# Patient Record
Sex: Male | Born: 1995 | Race: Black or African American | Hispanic: No | Marital: Single | Smoking: Current every day smoker
Health system: Southern US, Community
[De-identification: ages and names within clinical notes are randomized; demographics above are authoritative.]

## PROBLEM LIST (undated history)

## (undated) DIAGNOSIS — J45909 Unspecified asthma, uncomplicated: Secondary | ICD-10-CM

## (undated) HISTORY — PX: TONSILLECTOMY: SUR1361

---

## 2016-07-06 DIAGNOSIS — K1379 Other lesions of oral mucosa: Secondary | ICD-10-CM | POA: Insufficient documentation

## 2016-07-06 DIAGNOSIS — R0683 Snoring: Secondary | ICD-10-CM | POA: Insufficient documentation

## 2016-07-06 DIAGNOSIS — G4730 Sleep apnea, unspecified: Secondary | ICD-10-CM | POA: Insufficient documentation

## 2016-07-06 DIAGNOSIS — K219 Gastro-esophageal reflux disease without esophagitis: Secondary | ICD-10-CM | POA: Insufficient documentation

## 2016-07-06 DIAGNOSIS — J342 Deviated nasal septum: Secondary | ICD-10-CM | POA: Insufficient documentation

## 2016-07-06 DIAGNOSIS — J343 Hypertrophy of nasal turbinates: Secondary | ICD-10-CM | POA: Insufficient documentation

## 2016-07-19 DIAGNOSIS — J353 Hypertrophy of tonsils with hypertrophy of adenoids: Secondary | ICD-10-CM | POA: Insufficient documentation

## 2016-07-26 DIAGNOSIS — K1379 Other lesions of oral mucosa: Secondary | ICD-10-CM | POA: Insufficient documentation

## 2016-08-01 DIAGNOSIS — Z9089 Acquired absence of other organs: Secondary | ICD-10-CM | POA: Insufficient documentation

## 2016-12-18 ENCOUNTER — Emergency Department (HOSPITAL_COMMUNITY): Payer: Self-pay

## 2016-12-18 ENCOUNTER — Emergency Department (HOSPITAL_COMMUNITY)
Admission: EM | Admit: 2016-12-18 | Discharge: 2016-12-18 | Disposition: A | Discharge status: Home / Self Care | Payer: Self-pay | Attending: Emergency Medicine | Admitting: Emergency Medicine

## 2016-12-18 ENCOUNTER — Encounter (HOSPITAL_COMMUNITY): Payer: Self-pay | Admitting: Emergency Medicine

## 2016-12-18 DIAGNOSIS — R5383 Other fatigue: Secondary | ICD-10-CM | POA: Insufficient documentation

## 2016-12-18 DIAGNOSIS — B349 Viral infection, unspecified: Secondary | ICD-10-CM | POA: Insufficient documentation

## 2016-12-18 DIAGNOSIS — J45909 Unspecified asthma, uncomplicated: Secondary | ICD-10-CM | POA: Insufficient documentation

## 2016-12-18 HISTORY — DX: Unspecified asthma, uncomplicated: J45.909

## 2016-12-18 LAB — URINALYSIS, ROUTINE W REFLEX MICROSCOPIC
Bilirubin Urine: NEGATIVE
GLUCOSE, UA: NEGATIVE mg/dL
HGB URINE DIPSTICK: NEGATIVE
KETONES UR: NEGATIVE mg/dL
LEUKOCYTES UA: NEGATIVE
Nitrite: NEGATIVE
PROTEIN: NEGATIVE mg/dL
Specific Gravity, Urine: 1.025 (ref 1.005–1.030)
pH: 6 (ref 5.0–8.0)

## 2016-12-18 LAB — CBC WITH DIFFERENTIAL/PLATELET
Basophils Absolute: 0 10*3/uL (ref 0.0–0.1)
Basophils Relative: 0 %
Eosinophils Absolute: 0.2 10*3/uL (ref 0.0–0.7)
Eosinophils Relative: 2 %
HEMATOCRIT: 39.3 % (ref 39.0–52.0)
HEMOGLOBIN: 12.7 g/dL — AB (ref 13.0–17.0)
LYMPHS ABS: 3.1 10*3/uL (ref 0.7–4.0)
Lymphocytes Relative: 27 %
MCH: 28.2 pg (ref 26.0–34.0)
MCHC: 32.3 g/dL (ref 30.0–36.0)
MCV: 87.3 fL (ref 78.0–100.0)
MONO ABS: 0.7 10*3/uL (ref 0.1–1.0)
MONOS PCT: 6 %
NEUTROS ABS: 7.6 10*3/uL (ref 1.7–7.7)
NEUTROS PCT: 65 %
Platelets: 328 10*3/uL (ref 150–400)
RBC: 4.5 MIL/uL (ref 4.22–5.81)
RDW: 14.4 % (ref 11.5–15.5)
WBC: 11.6 10*3/uL — ABNORMAL HIGH (ref 4.0–10.5)

## 2016-12-18 LAB — BASIC METABOLIC PANEL
Anion gap: 9 (ref 5–15)
BUN: 19 mg/dL (ref 6–20)
CALCIUM: 8.8 mg/dL — AB (ref 8.9–10.3)
CHLORIDE: 105 mmol/L (ref 101–111)
CO2: 24 mmol/L (ref 22–32)
Creatinine, Ser: 1.33 mg/dL — ABNORMAL HIGH (ref 0.61–1.24)
GFR calc Af Amer: >60 mL/min (ref >60)
GLUCOSE: 113 mg/dL — AB (ref 65–99)
Potassium: 3.9 mmol/L (ref 3.5–5.1)
Sodium: 138 mmol/L (ref 135–145)

## 2016-12-18 LAB — RAPID STREP SCREEN (MED CTR MEBANE ONLY): Streptococcus, Group A Screen (Direct): NEGATIVE

## 2016-12-18 LAB — MONONUCLEOSIS SCREEN: Mono Screen: NEGATIVE

## 2016-12-18 NOTE — ED Provider Notes (Signed)
[facility]-EMERGENCY DEPT Provider Note   CSN: [id number] Arrival date & time: 12/18/16  0134     History   Chief Complaint Chief Complaint  Patient presents with  . cold symptoms    HPI Allen Moody is a 21 y.o. male.  HPI PT comes in with cc of generalized body aches, fatigue, headaches, sore throat. Symptoms have been present for several days now, however last night he felt the worst so he decided to come to the er. There is no associated n/v/f/c. Pt denies any sick contacts. Pt is a [employer] and he reports waking up extremely sore, but the symptoms have no direct correlation to his practice and he has never had symptoms like this in the past.    Past Medical History:  Diagnosis Date  . Asthma     There are no active problems to display for this patient.   Past Surgical History:  Procedure Laterality Date  . TONSILLECTOMY         Home Medications    Prior to Admission medications   Medication Sig Start Date End Date Taking? Authorizing Provider  Pseudoephedrine-Acetaminophen (ALKA-SELTZER PLUS COLD/SINUS PO) Take 1 tablet by mouth daily as needed (cold sx).   Yes [provider]    Family History History reviewed. No pertinent family history.  Social History Social History  Substance Use Topics  . Smoking status: Never Smoker  . Smokeless tobacco: Never Used  . Alcohol use No     Allergies   Patient has no known allergies.   Review of Systems Review of Systems  Constitutional: Positive for activity change. Negative for chills and fever.  HENT: Positive for sore throat.   Respiratory: Negative for cough.   Cardiovascular: Negative for chest pain.  Gastrointestinal: Negative for abdominal distention.  Musculoskeletal: Positive for arthralgias and myalgias. Negative for neck pain and neck stiffness.  Skin: Negative for rash.  Allergic/Immunologic: Negative for immunocompromised state.  Neurological: Positive for dizziness, weakness and  headaches.     Physical Exam Updated Vital Signs BP 130/79 (BP Location: Right Arm)   Pulse 78   Temp 97.8 F (36.6 C) (Oral)   Resp 20   Ht  (1.854 m)   Wt (!) 147.4 kg (325 lb)   SpO2 98%   BMI 42.88 kg/m   Physical Exam  Constitutional: He is oriented to person, place, and time. He appears well-developed.  HENT:  Head: Atraumatic.  Neck: Neck supple.  No nuchal rigidity  Cardiovascular: Normal rate.   Pulmonary/Chest: Effort normal and breath sounds normal. He has no wheezes.  Abdominal: Soft. There is no tenderness. There is no guarding.  Lymphadenopathy:    He has no cervical adenopathy.  Neurological: He is alert and oriented to person, place, and time.  Skin: Skin is warm.  Nursing note and vitals reviewed.    ED Treatments / Results  Labs (all labs ordered are listed, but only abnormal results are displayed) Labs Reviewed  CBC WITH DIFFERENTIAL/PLATELET - Abnormal; Notable for the following:       Result Value   WBC 11.6 (*)    Hemoglobin 12.7 (*)    All other components within normal limits  BASIC METABOLIC PANEL - Abnormal; Notable for the following:    Glucose, Bld 113 (*)    Creatinine, Ser 1.33 (*)    Calcium 8.8 (*)    All other components within normal limits  RAPID STREP SCREEN (NOT AT [facility])  CULTURE, GROUP A STREP [facility])  URINALYSIS, ROUTINE W REFLEX MICROSCOPIC  MONONUCLEOSIS SCREEN    EKG  EKG Interpretation None       Radiology Dg Chest Port 1 View  Result Date: 12/18/2016 CLINICAL DATA:  Shortness of breath and cough today. EXAM: PORTABLE CHEST 1 VIEW COMPARISON:  None. FINDINGS: Shallow inspiration. Normal heart size and pulmonary vascularity. No focal airspace disease or consolidation in the lungs. No blunting of costophrenic angles. No pneumothorax. Mediastinal contours appear intact. IMPRESSION: No active disease. Electronically Signed   By: Burman NievesWilliam  Stevens M.D.   On: 12/18/2016 06:28    Procedures Procedures (including  critical care time)  Medications Ordered in ED Medications - No data to display   Initial Impression / Assessment and Plan / ED Course  I have reviewed the triage vital signs and the nursing notes.  Pertinent labs & imaging results that were available during my care of the patient were reviewed by me and considered in my medical decision making (see chart for details).     Pt comes in with multiple non specific complains. Labs are reassuring. UA shows no hemoglobin and K is fine, so I doubt rhabdo.  We ordered strep and mono, and they are neg. CBC is unremarkable. Pt has no nuchal rigidity and the headache is mild currently. Symptoms have been present for a long time now. I am not sure what the cause of his symptoms are. I think over training is possible, dehydration is possible - but pt doesn't think that's the case. I have asked him to exercise caution and to listne to his body. Strict ER return precautions have been discussed, and patient is agreeing with the plan and is comfortable with the workup done and the recommendations from the ER.   Final Clinical Impressions(s) / ED Diagnoses   Final diagnoses:  Acute viral syndrome  Fatigue, unspecified type    New Prescriptions Discharge Medication List as of 12/18/2016  7:10 AM       Derwood KaplanNanavati, Merve Hotard, MD 12/18/16 [id number]

## 2016-12-18 NOTE — ED Triage Notes (Signed)
Pt c/o generalized body ache and HA, extreme fatigue and with flue like symptoms. No fever or chills.

## 2016-12-18 NOTE — Discharge Instructions (Signed)
All the results in the ER are normal, labs and imaging. We are not sure what is causing your symptoms. The workup in the ER is not complete, and is limited to screening for life threatening and emergent conditions only, so please see a primary care doctor for further evaluation.  Please hydrate well. Give your body some rest if you think you are being over trained.

## 2016-12-20 LAB — CULTURE, GROUP A STREP (THRC)

## 2018-09-15 ENCOUNTER — Emergency Department (HOSPITAL_BASED_OUTPATIENT_CLINIC_OR_DEPARTMENT_OTHER)
Admission: EM | Admit: 2018-09-15 | Discharge: 2018-09-15 | Disposition: A | Discharge status: Home / Self Care | Payer: Self-pay | Attending: Emergency Medicine | Admitting: Emergency Medicine

## 2018-09-15 ENCOUNTER — Encounter (HOSPITAL_BASED_OUTPATIENT_CLINIC_OR_DEPARTMENT_OTHER): Payer: Self-pay | Admitting: Emergency Medicine

## 2018-09-15 ENCOUNTER — Other Ambulatory Visit: Payer: Self-pay

## 2018-09-15 ENCOUNTER — Emergency Department (HOSPITAL_BASED_OUTPATIENT_CLINIC_OR_DEPARTMENT_OTHER): Payer: Self-pay

## 2018-09-15 DIAGNOSIS — S022XXA Fracture of nasal bones, initial encounter for closed fracture: Secondary | ICD-10-CM | POA: Insufficient documentation

## 2018-09-15 DIAGNOSIS — J45909 Unspecified asthma, uncomplicated: Secondary | ICD-10-CM | POA: Insufficient documentation

## 2018-09-15 DIAGNOSIS — W51XXXA Accidental striking against or bumped into by another person, initial encounter: Secondary | ICD-10-CM | POA: Insufficient documentation

## 2018-09-15 DIAGNOSIS — Y999 Unspecified external cause status: Secondary | ICD-10-CM | POA: Insufficient documentation

## 2018-09-15 DIAGNOSIS — F17200 Nicotine dependence, unspecified, uncomplicated: Secondary | ICD-10-CM | POA: Insufficient documentation

## 2018-09-15 DIAGNOSIS — Y939 Activity, unspecified: Secondary | ICD-10-CM | POA: Insufficient documentation

## 2018-09-15 DIAGNOSIS — Y929 Unspecified place or not applicable: Secondary | ICD-10-CM | POA: Insufficient documentation

## 2018-09-15 NOTE — ED Notes (Signed)
Patient transported to CT 
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
## 2018-09-15 NOTE — ED Notes (Signed)
Pt given a mask to wear prior to transport to CT. He states he feels he will be comfortable enough wearing it despite his facial injury.

## 2018-09-15 NOTE — ED Notes (Signed)
Pt understood dc material. NAD noted. All questions answered to satisfaction. Pt escorted to check out window 

## 2018-09-17 DIAGNOSIS — S022XXB Fracture of nasal bones, initial encounter for open fracture: Secondary | ICD-10-CM | POA: Insufficient documentation

## 2018-11-07 ENCOUNTER — Encounter (HOSPITAL_COMMUNITY): Payer: Self-pay | Admitting: Emergency Medicine

## 2018-11-07 ENCOUNTER — Other Ambulatory Visit: Payer: Self-pay

## 2018-11-07 ENCOUNTER — Emergency Department (HOSPITAL_COMMUNITY)
Admission: EM | Admit: 2018-11-07 | Discharge: 2018-11-07 | Discharge status: Against Medical Advice | Payer: Self-pay | Attending: Emergency Medicine | Admitting: Emergency Medicine

## 2018-11-07 DIAGNOSIS — Z5321 Procedure and treatment not carried out due to patient leaving prior to being seen by health care provider: Secondary | ICD-10-CM | POA: Insufficient documentation
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
## 2020-04-21 ENCOUNTER — Emergency Department (HOSPITAL_COMMUNITY)
Admission: EM | Admit: 2020-04-21 | Discharge: 2020-04-21 | Disposition: A | Discharge status: Home / Self Care | Payer: No Typology Code available for payment source | Attending: Emergency Medicine | Admitting: Emergency Medicine

## 2020-04-21 ENCOUNTER — Emergency Department (HOSPITAL_COMMUNITY): Payer: No Typology Code available for payment source

## 2020-04-21 DIAGNOSIS — S301XXA Contusion of abdominal wall, initial encounter: Secondary | ICD-10-CM

## 2020-04-21 DIAGNOSIS — R519 Headache, unspecified: Secondary | ICD-10-CM | POA: Insufficient documentation

## 2020-04-21 DIAGNOSIS — Y92410 Unspecified street and highway as the place of occurrence of the external cause: Secondary | ICD-10-CM | POA: Diagnosis not present

## 2020-04-21 DIAGNOSIS — M25551 Pain in right hip: Secondary | ICD-10-CM | POA: Insufficient documentation

## 2020-04-21 DIAGNOSIS — M25512 Pain in left shoulder: Secondary | ICD-10-CM | POA: Insufficient documentation

## 2020-04-21 DIAGNOSIS — R0781 Pleurodynia: Secondary | ICD-10-CM | POA: Insufficient documentation

## 2020-04-21 DIAGNOSIS — J45909 Unspecified asthma, uncomplicated: Secondary | ICD-10-CM | POA: Diagnosis not present

## 2020-04-21 DIAGNOSIS — F17290 Nicotine dependence, other tobacco product, uncomplicated: Secondary | ICD-10-CM | POA: Diagnosis not present

## 2020-04-21 DIAGNOSIS — S20219A Contusion of unspecified front wall of thorax, initial encounter: Secondary | ICD-10-CM

## 2020-04-21 LAB — COMPREHENSIVE METABOLIC PANEL
ALT: 31 U/L (ref 0–44)
AST: 25 U/L (ref 15–41)
Albumin: 2.8 g/dL — ABNORMAL LOW (ref 3.5–5.0)
Alkaline Phosphatase: 44 U/L (ref 38–126)
Anion gap: 6 (ref 5–15)
BUN: 10 mg/dL (ref 6–20)
CO2: 21 mmol/L — ABNORMAL LOW (ref 22–32)
Calcium: 6.5 mg/dL — ABNORMAL LOW (ref 8.9–10.3)
Chloride: 114 mmol/L — ABNORMAL HIGH (ref 98–111)
Creatinine, Ser: 0.87 mg/dL (ref 0.61–1.24)
GFR, Estimated: >60 mL/min (ref >60)
Glucose, Bld: 72 mg/dL (ref 70–99)
Potassium: 3.1 mmol/L — ABNORMAL LOW (ref 3.5–5.1)
Sodium: 141 mmol/L (ref 135–145)
Total Bilirubin: 0.4 mg/dL (ref 0.3–1.2)
Total Protein: 5 g/dL — ABNORMAL LOW (ref 6.5–8.1)

## 2020-04-21 LAB — CBC WITH DIFFERENTIAL/PLATELET
Abs Immature Granulocytes: 0.05 10*3/uL (ref 0.00–0.07)
Basophils Absolute: 0 10*3/uL (ref 0.0–0.1)
Basophils Relative: 0 %
Eosinophils Absolute: 0 10*3/uL (ref 0.0–0.5)
Eosinophils Relative: 0 %
HCT: 42.8 % (ref 39.0–52.0)
Hemoglobin: 14 g/dL (ref 13.0–17.0)
Immature Granulocytes: 0 %
Lymphocytes Relative: 20 %
Lymphs Abs: 2.7 10*3/uL (ref 0.7–4.0)
MCH: 29.4 pg (ref 26.0–34.0)
MCHC: 32.7 g/dL (ref 30.0–36.0)
MCV: 89.9 fL (ref 80.0–100.0)
Monocytes Absolute: 0.9 10*3/uL (ref 0.1–1.0)
Monocytes Relative: 6 %
Neutro Abs: 9.9 10*3/uL — ABNORMAL HIGH (ref 1.7–7.7)
Neutrophils Relative %: 74 %
Platelets: 286 10*3/uL (ref 150–400)
RBC: 4.76 MIL/uL (ref 4.22–5.81)
RDW: 13.2 % (ref 11.5–15.5)
WBC: 13.6 10*3/uL — ABNORMAL HIGH (ref 4.0–10.5)
nRBC: 0 % (ref 0.0–0.2)

## 2020-04-21 MED ORDER — HYDROMORPHONE HCL 1 MG/ML IJ SOLN
1.0000 mg | Freq: Once | INTRAMUSCULAR | Status: AC
  Administered 2020-04-21: 1 mg via INTRAVENOUS
  Filled 2020-04-21: qty 1

## 2020-04-21 MED ORDER — IOHEXOL 300 MG/ML  SOLN
100.0000 mL | Freq: Once | INTRAMUSCULAR | Status: AC | PRN
  Administered 2020-04-21: 100 mL via INTRAVENOUS

## 2020-04-21 MED ORDER — METHOCARBAMOL 500 MG PO TABS: 500.0000 mg | ORAL_TABLET | Freq: Two times a day (BID) | ORAL | 0 refills | Status: DC
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
## 2020-04-21 NOTE — ED Notes (Signed)
Pt returned from CT at this time.  
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
## 2020-07-03 DIAGNOSIS — M545 Low back pain, unspecified: Secondary | ICD-10-CM | POA: Insufficient documentation

## 2020-07-03 DIAGNOSIS — R0789 Other chest pain: Secondary | ICD-10-CM | POA: Insufficient documentation
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
## 2022-05-10 ENCOUNTER — Ambulatory Visit (HOSPITAL_COMMUNITY)
Admission: EM | Admit: 2022-05-10 | Discharge: 2022-05-10 | Disposition: A | Discharge status: Home / Self Care | Payer: 59 | Attending: Family Medicine | Admitting: Family Medicine

## 2022-05-10 ENCOUNTER — Encounter (HOSPITAL_COMMUNITY): Payer: Self-pay

## 2022-05-10 DIAGNOSIS — J069 Acute upper respiratory infection, unspecified: Secondary | ICD-10-CM | POA: Diagnosis not present

## 2022-05-10 DIAGNOSIS — R030 Elevated blood-pressure reading, without diagnosis of hypertension: Secondary | ICD-10-CM | POA: Diagnosis not present

## 2022-05-10 MED ORDER — PREDNISONE 20 MG PO TABS: 40.0000 mg | ORAL_TABLET | Freq: Every day | ORAL | 0 refills | Status: AC

## 2022-05-10 NOTE — Discharge Instructions (Signed)
You may try using over the counter AFRIN nasal spray as directed on the bottle.  Your blood pressure was noted to be elevated during your visit today. If you are currently taking medication for high blood pressure, please ensure you are taking this as directed. If you do not have a history of high blood pressure and your blood pressure remains persistently elevated, you may need to begin taking a medication at some point. You may return here within the next few days to recheck if unable to see your primary care provider or if you do not have a one.  BP (!) 169/112 (BP Location: Left Arm)   Pulse 91   Temp 97.9 F (36.6 C) (Oral)   Resp 18   SpO2 99%   BP Readings from Last 3 Encounters:  05/10/22 (!) 169/112  04/21/20 128/81  09/15/18 139/90

## 2022-05-10 NOTE — ED Triage Notes (Signed)
Pt presents to the office for sinus pressure and headache x 3 days. Pt is not taking any medication at this time.

## 2022-05-11 NOTE — ED Provider Notes (Signed)
[place]   [id number] 05/10/22 Arrival Time: 4401  ASSESSMENT & PLAN:  1. Viral URI   2. Elevated blood pressure reading without diagnosis of hypertension    Discussed typical duration of likely viral illness. No h/o HTN. Will return for recheck when feeling better. OTC symptom care as needed.  Discharge Medication List as of 05/10/2022  6:28 PM     START taking these medications   Details  predniSONE (DELTASONE) 20 MG tablet Take 2 tablets (40 mg total) by mouth daily., Starting Tue 05/10/2022, Normal         Discharge Instructions      You may try using over the counter AFRIN nasal spray as directed on the bottle.  Your blood pressure was noted to be elevated during your visit today. If you are currently taking medication for high blood pressure, please ensure you are taking this as directed. If you do not have a history of high blood pressure and your blood pressure remains persistently elevated, you may need to begin taking a medication at some point. You may return here within the next few days to recheck if unable to see your primary care provider or if you do not have a one.  BP (!) 169/112 (BP Location: Left Arm)   Pulse 91   Temp 97.9 F (36.6 C) (Oral)   Resp 18   SpO2 99%   BP Readings from Last 3 Encounters:  05/10/22 (!) 169/112  04/21/20 128/81  09/15/18 139/90         Follow-up Information     [facility] [place].   Specialty: Urgent Care Why: If worsening or failing to improve as anticipated. Contact information: [place] [phone]                Reviewed expectations re: course of current medical issues. Questions answered. Outlined signs and symptoms indicating need for more acute intervention. Understanding verbalized. After Visit Summary given.   SUBJECTIVE: History from: Patient. Allen Moody is a 27 y.o. male. Pt presents for sinus pressure  and headache x 3 days. Overall fatigued. With body aches. Pt is not taking any medication at this time.  Denies: fever and difficulty breathing. Normal PO intake without n/v/d.  Increased blood pressure noted today. Reports that he has not been treated for hypertension in the past. He reports no chest pain on exertion, no dyspnea on exertion, no swelling of ankles, no orthostatic dizziness or lightheadedness, no orthopnea or paroxysmal nocturnal dyspnea, no palpitations, and no intermittent claudication symptoms.  OBJECTIVE:  Vitals:   05/10/22 1745  BP: (!) 169/112  Pulse: 91  Resp: 18  Temp: 97.9 F (36.6 C)  TempSrc: Oral  SpO2: 99%    General appearance: alert; no distress Eyes: PERRLA; EOMI; conjunctiva normal HENT: [place]; AT; with nasal congestion Neck: supple  Lungs: speaks full sentences without difficulty; unlabored CV: reg Extremities: no edema Skin: warm and dry Neurologic: normal gait Psychological: alert and cooperative; normal mood and affect   No Known Allergies  Past Medical History:  Diagnosis Date   Asthma    Social History   Socioeconomic History   Marital status: Single    Spouse name: Not on file   Number of children: Not on file   Years of education: Not on file   Highest education level: Not on file  Occupational History   Not on file  Tobacco Use   Smoking status: Every Day  Types: Cigars   Smokeless tobacco: Never  Vaping Use   Vaping Use: Never used  Substance and Sexual Activity   Alcohol use: Yes    Comment: occasional   Drug use: No   Sexual activity: Not on file  Other Topics Concern   Not on file  Social History Narrative   Not on file   Social Determinants of Health   Financial Resource Strain: Not on file  Food Insecurity: Not on file  Transportation Needs: Not on file  Physical Activity: Not on file  Stress: Not on file  Social Connections: Not on file  Intimate Partner Violence: Not on file   History reviewed.  No pertinent family history. Past Surgical History:  Procedure Laterality Date   TONSILLECTOMY       Vanessa Kick, MD 05/11/22 1022

## 2023-04-12 DEATH — deceased
